# Patient Record
Sex: Female | Born: 1990 | Race: White | Hispanic: No | Marital: Single | Smoking: Never smoker
Health system: Southern US, Community
[De-identification: ages and names within clinical notes are randomized; demographics above are authoritative.]

---

## 2014-08-04 DIAGNOSIS — J301 Allergic rhinitis due to pollen: Secondary | ICD-10-CM | POA: Insufficient documentation

## 2014-08-28 DIAGNOSIS — F988 Other specified behavioral and emotional disorders with onset usually occurring in childhood and adolescence: Secondary | ICD-10-CM | POA: Insufficient documentation

## 2015-05-11 DIAGNOSIS — N926 Irregular menstruation, unspecified: Secondary | ICD-10-CM | POA: Insufficient documentation

## 2018-03-13 ENCOUNTER — Ambulatory Visit: Payer: BC Managed Care – PPO | Admitting: Family Medicine

## 2018-03-13 ENCOUNTER — Encounter: Payer: Self-pay | Admitting: Family Medicine

## 2018-03-13 VITALS — BP 106/71 | HR 73 | Ht 63.0 in | Wt 102.0 lb

## 2018-03-13 DIAGNOSIS — M79604 Pain in right leg: Secondary | ICD-10-CM

## 2018-03-13 NOTE — Patient Instructions (Signed)
I'm concerned you have a stress reaction of your distal tibia. We will go ahead with an MRI of your tibia/fibula to further assess. Ultrasound shows only some very mild edema over the cortex but no fracture - this is about 80% accurate for diagnosis. No running, plyometrics, I'd avoid elliptical too - ok for cycling, swimming.  Use pain as your guide for other activities. Tylenol, meloxicam only if needed. I'll call you with results and next steps. Your left ankle pain is due to peroneal tendinitis and tenosynovitis. Arch supports will help with this if it continues to be an issue (spencos, superfeet) - the [employer] are excellent running shoes. Theraband strengthening of this ankle and continue your rehab with [place].

## 2018-03-14 ENCOUNTER — Ambulatory Visit (HOSPITAL_BASED_OUTPATIENT_CLINIC_OR_DEPARTMENT_OTHER)
Admission: RE | Admit: 2018-03-14 | Discharge: 2018-03-14 | Disposition: A | Discharge status: Home / Self Care | Payer: BC Managed Care – PPO | Source: Ambulatory Visit | Attending: Family Medicine | Admitting: Family Medicine

## 2018-03-14 ENCOUNTER — Encounter (HOSPITAL_BASED_OUTPATIENT_CLINIC_OR_DEPARTMENT_OTHER): Payer: Self-pay | Admitting: Radiology

## 2018-03-14 DIAGNOSIS — M79604 Pain in right leg: Secondary | ICD-10-CM | POA: Diagnosis not present

## 2018-03-15 ENCOUNTER — Encounter: Payer: Self-pay | Admitting: Family Medicine

## 2018-03-15 NOTE — Progress Notes (Signed)
PCP: Herma Carson, MD  Subjective:   HPI: Patient is a 28 y.o. female here for right shin pain.  Patient reports she did a full marathon on February 03, 2018. She did well, felt sore overall but nothing focal. Then did a 45 minute run the following Sunday and felt bad including pain right lower medial shin. Did a 7 mile run on 12/24 and could hardly do so due to pain with impact. Pain has continued with 0/10 at rest and with walking, 7/10 and sharp with impact. Still running a few miles at a time but not daily due to pain. No bruising. Some swelling before the marathon in December but didn't have pain. Takes calcium and vitamin D. Menses are irregular - recently put on OCPs due to this. Has history of one known tibial stress fracture on the left 1.5 years ago. No skin changes, numbness.  History reviewed. No pertinent past medical history.  Current Outpatient Medications on File Prior to Visit  Medication Sig Dispense Refill  . norethindrone-ethinyl estradiol (MICROGESTIN,JUNEL,LOESTRIN) 1-20 MG-MCG tablet 1 p.o. daily    . amphetamine-dextroamphetamine (ADDERALL) 20 MG tablet TK 1 T PO BID     No current facility-administered medications on file prior to visit.     History reviewed. No pertinent surgical history.  No Known Allergies  Social History   Socioeconomic History  . Marital status: Single    Spouse name: Not on file  . Number of children: Not on file  . Years of education: Not on file  . Highest education level: Not on file  Occupational History  . Not on file  Social Needs  . Financial resource strain: Not on file  . Food insecurity:    Worry: Not on file    Inability: Not on file  . Transportation needs:    Medical: Not on file    Non-medical: Not on file  Tobacco Use  . Smoking status: Never Smoker  . Smokeless tobacco: Never Used  Substance and Sexual Activity  . Alcohol use: Not on file  . Drug use: Not on file  . Sexual activity: Not on file   Lifestyle  . Physical activity:    Days per week: Not on file    Minutes per session: Not on file  . Stress: Not on file  Relationships  . Social connections:    Talks on phone: Not on file    Gets together: Not on file    Attends religious service: Not on file    Active member of club or organization: Not on file    Attends meetings of clubs or organizations: Not on file    Relationship status: Not on file  . Intimate partner violence:    Fear of current or ex partner: Not on file    Emotionally abused: Not on file    Physically abused: Not on file    Forced sexual activity: Not on file  Other Topics Concern  . Not on file  Social History Narrative  . Not on file    History reviewed. No pertinent family history.  BP 106/71   Pulse 73   Ht 5\' 3"  (1.6 m)   Wt 102 lb (46.3 kg)   LMP 03/11/2018   BMI 18.07 kg/m   Review of Systems: See HPI above.     Objective:  Physical Exam:  Gen: NAD, comfortable in exam room  Right ankle/lower leg: No gross deformity, swelling, ecchymoses FROM ankle with 5/5 strength - minimal pain  with dorsiflexion of the ankle. TTP focally posterior aspect medial distal tibia. Negative ant drawer and talar tilt.   Negative syndesmotic compression. Thompsons test negative. NV intact distally. Unable to do hop test due to pain distal tibia. Negative fulcrum.  Left ankle/lower leg: No deformity. FROM with 5/5 strength - mild pain with ER.. Minimal tenderness over peroneal tendons.  No other tenderness. NVI distally.   MSK u/s right lower leg:  Very small amount of edema overlying tibial cortex in area of pain.  No cortical irregularity here or neovascularity.  Post tib, flexor digitorum tendons and muscles without abnormalities.  Left lower leg peroneus brevis thickened at level of ankle joint and just distal with neovascularity, target sign.  Assessment & Plan:  1. Right leg pain - Independently reviewed radiographs showing very  thickened anterior tibial cortex without stress fracture - this is also not in area of her tenderness suggesting the thickening is chronic.  No fracture noted on ultrasound or radiographs but these are not as sensitive as MRI.  Her history, exam with inability to perform hop test and focal tenderness in distance runner, oligomenorrhea concerning for stress reaction vs fracture - will go ahead with MRI to further assess.  Tylenol, meloxicam only if needed.  Discussed avoiding running for now.  Activities only that do not cause pain.  Arch supports.  We discussed calcium, vitamin D.  Consider checking 25-OH vit D, bone density, ferritin in future.  Left ankle pain is mild but consistent with peroneal tenosynovitis and tendinitis - will work with her physical therapist.  Also recommended arch supports.

## 2018-03-15 NOTE — Addendum Note (Signed)
Addended by: Kathi Simpers F on: 03/15/2018 10:08 AM   Modules accepted: Orders

## 2018-03-19 ENCOUNTER — Ambulatory Visit
Admission: RE | Admit: 2018-03-19 | Discharge: 2018-03-19 | Disposition: A | Discharge status: Home / Self Care | Payer: BC Managed Care – PPO | Source: Ambulatory Visit | Attending: Family Medicine | Admitting: Family Medicine

## 2018-03-19 DIAGNOSIS — M79604 Pain in right leg: Secondary | ICD-10-CM

## 2018-03-25 ENCOUNTER — Ambulatory Visit: Payer: Self-pay | Admitting: Family Medicine

## 2018-03-25 ENCOUNTER — Encounter

## 2018-03-27 ENCOUNTER — Ambulatory Visit: Payer: BC Managed Care – PPO | Admitting: Family Medicine

## 2018-03-27 ENCOUNTER — Encounter: Payer: Self-pay | Admitting: Family Medicine

## 2018-03-27 VITALS — BP 114/75 | HR 69 | Ht 63.0 in | Wt 102.0 lb

## 2018-03-27 DIAGNOSIS — M79604 Pain in right leg: Secondary | ICD-10-CM

## 2018-03-27 NOTE — Patient Instructions (Signed)
Follow protocol as we discussed. It's likely going to be 1-2 weeks before you can advance to the walk/jog portion of the protocol. Wear aircast when up and walking around regularly. Icing if needed. Cycling/spin for exercise. Follow up with me in 4 weeks.

## 2018-03-27 NOTE — Progress Notes (Signed)
PCP: Herma CarsonMandry, Heidi, MD  Subjective:   HPI: Patient is a 28 y.o. female here for right shin pain.  1/15: Patient reports she did a full marathon on February 03, 2018. She did well, felt sore overall but nothing focal. Then did a 45 minute run the following Sunday and felt bad including pain right lower medial shin. Did a 7 mile run on 12/24 and could hardly do so due to pain with impact. Pain has continued with 0/10 at rest and with walking, 7/10 and sharp with impact. Still running a few miles at a time but not daily due to pain. No bruising. Some swelling before the marathon in December but didn't have pain. Takes calcium and vitamin D. Menses are irregular - recently put on OCPs due to this. Has history of one known tibial stress fracture on the left 1.5 years ago. No skin changes, numbness.  1/29: Patient reports pain is down to 4/10 at worst with walking. Feels like she is improving. Cycling on spin bike without pain unless she puts resistance too high. No skin changes, numbness.  History reviewed. No pertinent past medical history.  Current Outpatient Medications on File Prior to Visit  Medication Sig Dispense Refill  . amphetamine-dextroamphetamine (ADDERALL) 20 MG tablet TK 1 T PO BID    . norethindrone-ethinyl estradiol (MICROGESTIN,JUNEL,LOESTRIN) 1-20 MG-MCG tablet 1 p.o. daily     No current facility-administered medications on file prior to visit.     History reviewed. No pertinent surgical history.  No Known Allergies  Social History   Socioeconomic History  . Marital status: Single    Spouse name: Not on file  . Number of children: Not on file  . Years of education: Not on file  . Highest education level: Not on file  Occupational History  . Not on file  Social Needs  . Financial resource strain: Not on file  . Food insecurity:    Worry: Not on file    Inability: Not on file  . Transportation needs:    Medical: Not on file    Non-medical: Not on  file  Tobacco Use  . Smoking status: Never Smoker  . Smokeless tobacco: Never Used  Substance and Sexual Activity  . Alcohol use: Not on file  . Drug use: Not on file  . Sexual activity: Not on file  Lifestyle  . Physical activity:    Days per week: Not on file    Minutes per session: Not on file  . Stress: Not on file  Relationships  . Social connections:    Talks on phone: Not on file    Gets together: Not on file    Attends religious service: Not on file    Active member of club or organization: Not on file    Attends meetings of clubs or organizations: Not on file    Relationship status: Not on file  . Intimate partner violence:    Fear of current or ex partner: Not on file    Emotionally abused: Not on file    Physically abused: Not on file    Forced sexual activity: Not on file  Other Topics Concern  . Not on file  Social History Narrative  . Not on file    History reviewed. No pertinent family history.  BP 114/75   Pulse 69   Ht 5\' 3"  (1.6 m)   Wt 102 lb (46.3 kg)   LMP 03/11/2018   BMI 18.07 kg/m   Review of Systems:  See HPI above.     Objective:  Physical Exam:  Gen: NAD, comfortable in exam room  Right ankle/lower leg: No gross deformity, swelling, ecchymoses FROM without pain; 5/5 strength TTP mildly medial distal tibia. Negative ant drawer and talar tilt. No pain loading of heel. Thompsons test negative. NV intact distally.  Assessment & Plan:  1. Right leg pain - 2/2 stress reaction of distal tibia.  Improving but still with 4/10 level of pain and has some pain with ambulation.  Long aircast applied today.  We reviewed return to running protocol with aircast.  Icing, calcium and vitamin D.  Cycling for exercise if not painful.  F/u in 4 weeks.  Consider checking 25-OH vit D, bone density, ferritin in future.

## 2019-05-09 ENCOUNTER — Ambulatory Visit: Payer: BC Managed Care – PPO

## 2019-10-29 ENCOUNTER — Other Ambulatory Visit: Payer: Self-pay

## 2019-10-29 ENCOUNTER — Ambulatory Visit: Payer: BC Managed Care – PPO | Admitting: Family Medicine

## 2019-10-29 ENCOUNTER — Encounter: Payer: Self-pay | Admitting: Family Medicine

## 2019-10-29 VITALS — BP 98/68 | Ht 63.0 in | Wt 110.0 lb

## 2019-10-29 DIAGNOSIS — M25552 Pain in left hip: Secondary | ICD-10-CM

## 2019-10-29 NOTE — Patient Instructions (Signed)
You have strain/spasm of your hip external rotators and glut medius. Continue with treatment from [place] and [place] but add the home exercises, stretches. Strengthening exercises do once a day 3 sets of 10. Stretches hold for 20-30 seconds, repeat 3 times. I would refrain from elliptical, running for now and use cycling, swimming for cross training. Use pain as your guide over the next couple weeks on returning to running - call me if you have questions (or mychart). Ibuprofen OR aleve for 7 days then as needed. Ice (or heat, whichever feels better at this point) 15 minutes at a time up to 3-4 times a day. Follow up with me about 1-2 weeks before  [facility] for reevaluation.

## 2019-10-29 NOTE — Progress Notes (Signed)
PCP: Herma Carson, MD  Subjective:   HPI: Patient is a 29 y.o. female here for left hip pain.  Patient is currently training for [facility] in about 5.5 weeks. She reports about 2 weeks ago she started to get pain in left gluteal region. Started about 3 days after doing a different strength workout. Had some general overall soreness, ran about 7 miles on a treadmill and felt pain worsen in left gluteal region. Feels worse after doing elliptical. Has seen PT ([place]) and chiropractor [facility]) without much relief so far. Pain doesn't worsen while running but worse afterwards. Has mainly been focused on elliptical past 2 weeks. No radiation down leg. No numbness/tingling.  History reviewed. No pertinent past medical history.  Current Outpatient Medications on File Prior to Visit  Medication Sig Dispense Refill  . amphetamine-dextroamphetamine (ADDERALL) 20 MG tablet TK 1 T PO BID    . hydrOXYzine (VISTARIL) 25 MG capsule Take 25 mg by mouth daily as needed.    . norethindrone-ethinyl estradiol (MICROGESTIN,JUNEL,LOESTRIN) 1-20 MG-MCG tablet 1 p.o. daily     No current facility-administered medications on file prior to visit.    History reviewed. No pertinent surgical history.  No Known Allergies  Social History   Socioeconomic History  . Marital status: Single    Spouse name: Not on file  . Number of children: Not on file  . Years of education: Not on file  . Highest education level: Not on file  Occupational History  . Not on file  Tobacco Use  . Smoking status: Never Smoker  . Smokeless tobacco: Never Used  Substance and Sexual Activity  . Alcohol use: Not on file  . Drug use: Not on file  . Sexual activity: Not on file  Other Topics Concern  . Not on file  Social History Narrative  . Not on file   Social Determinants of Health   Financial Resource Strain:   . Difficulty of Paying Living Expenses: Not on file  Food Insecurity:   . Worried  About Programme researcher, broadcasting/film/video in the Last Year: Not on file  . Ran Out of Food in the Last Year: Not on file  Transportation Needs:   . Lack of Transportation (Medical): Not on file  . Lack of Transportation (Non-Medical): Not on file  Physical Activity:   . Days of Exercise per Week: Not on file  . Minutes of Exercise per Session: Not on file  Stress:   . Feeling of Stress : Not on file  Social Connections:   . Frequency of Communication with Friends and Family: Not on file  . Frequency of Social Gatherings with Friends and Family: Not on file  . Attends Religious Services: Not on file  . Active Member of Clubs or Organizations: Not on file  . Attends Banker Meetings: Not on file  . Marital Status: Not on file  Intimate Partner Violence:   . Fear of Current or Ex-Partner: Not on file  . Emotionally Abused: Not on file  . Physically Abused: Not on file  . Sexually Abused: Not on file    History reviewed. No pertinent family history.  BP 98/68   Ht 5\' 3"  (1.6 m)   Wt 110 lb (49.9 kg)   BMI 19.49 kg/m   Review of Systems: See HPI above.     Objective:  Physical Exam:  Gen: NAD, comfortable in exam room  Left hip: No deformity, swelling. FROM with 5/5 strength except 5-/5 with  hip abduction.  Pain also with external rotation. Tenderness to palpation over glut medius and piriformis with spasm compared to right. NVI distally. Negative hop. Negative logroll, faber, fadir.   Assessment & Plan:  1. Left hip pain - 2/2 strain/spasm of external rotators (esp piriformis) and glut medius.  Focus on stretching, strengthening exercises which were reviewed today.  Continue working with [place] and [place].  Ibuprofen or aleve for 7 days then as needed.  Ice or heat.  F/u in 4 weeks for reevaluation.  Discussed cycling, swimming for cross training as long as doesn't worsen pain.

## 2020-09-28 IMAGING — MR MR [PERSON_NAME] LOW W/O CM*R*
4 of 5 series · 24 of 40 positions shown · non-contrast
Comparison: None.

CLINICAL DATA: Lower leg trauma, pain

EXAM:
MRI OF LOWER RIGHT EXTREMITY WITHOUT CONTRAST
TECHNIQUE: Multiplanar, multisequence MR imaging of the right lower leg was
performed. No intravenous contrast was administered.

[Series 5: T1 · coronal · 4.0mm · 1.20mm/px · 6 of 26 slices shown (1 of 2)]
[im 1/26]
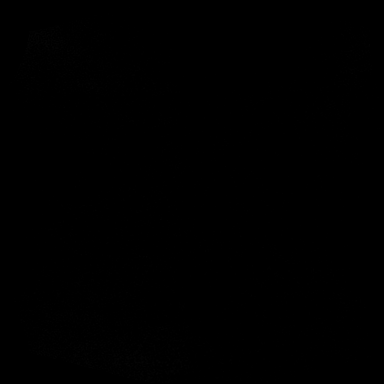
[im 6/26]
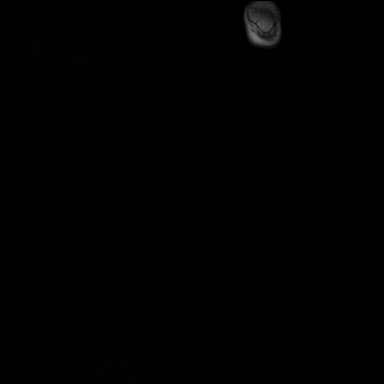
[im 11/26]
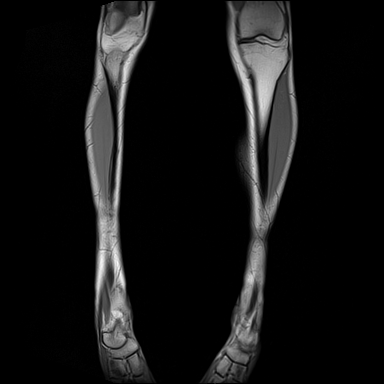
[im 16/26]
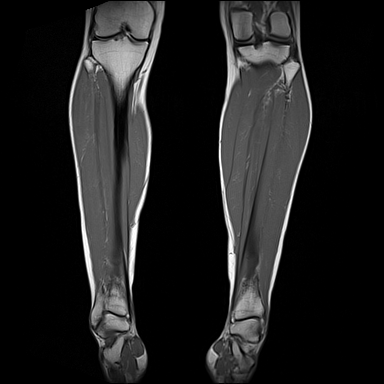
[im 21/26]
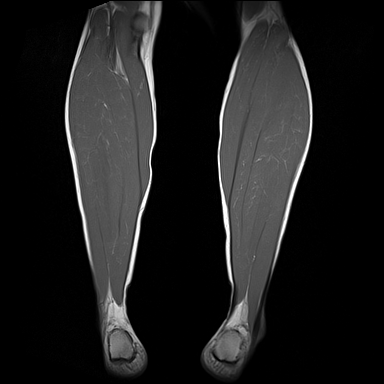
[im 26/26]
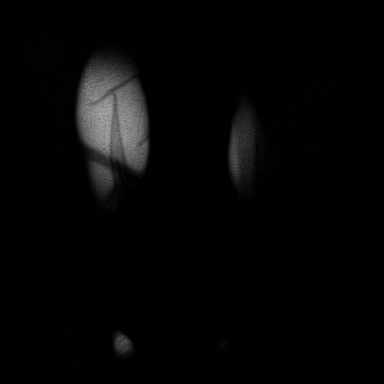

[Series 6: STIR · coronal · 4.0mm · 1.44mm/px · 3 of 26 slices shown]
[im 1/26]
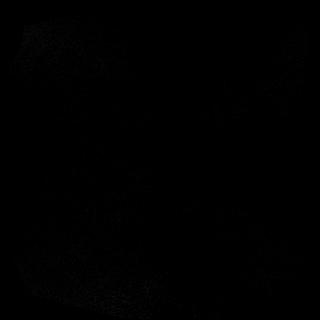
[im 13/26]
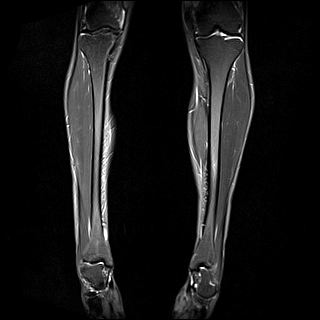
[im 26/26]
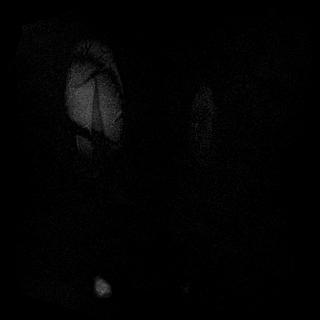

[Series 7: T1 · axial · 5.0mm · 0.59mm/px · z∈[-226,+118]mm · 6 of 57 slices shown (2 of 2)]
[im 1/57]
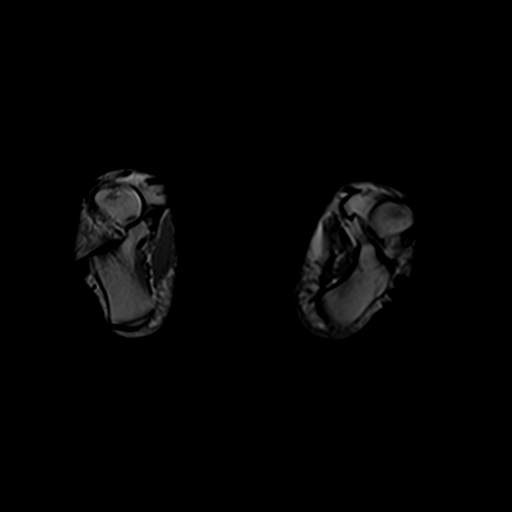
[im 6/57]
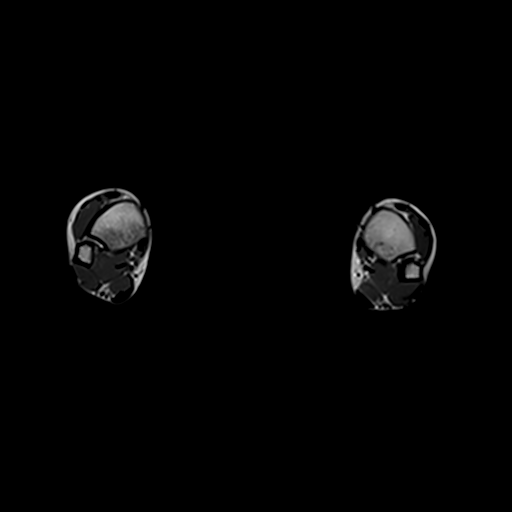
[im 12/57]
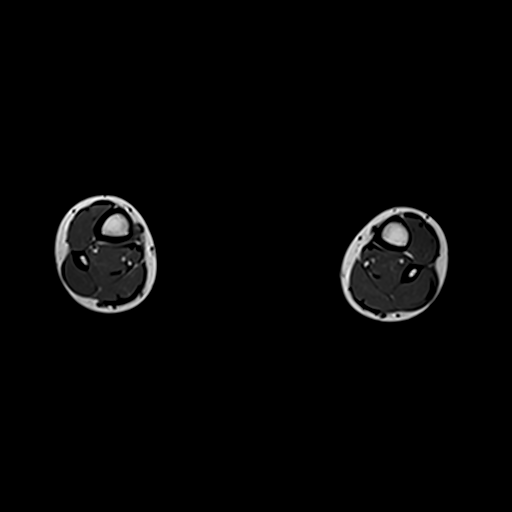
[im 17/57]
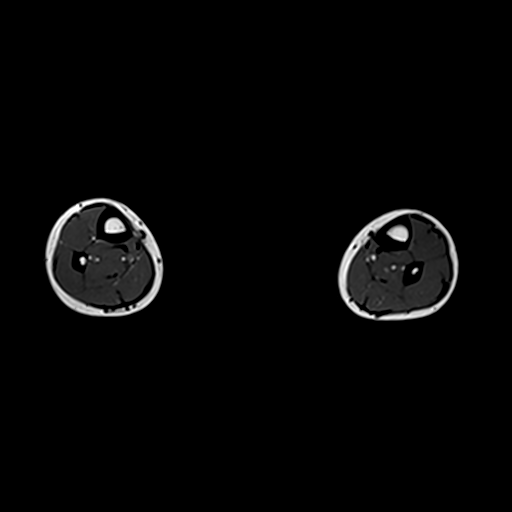
[im 29/57]
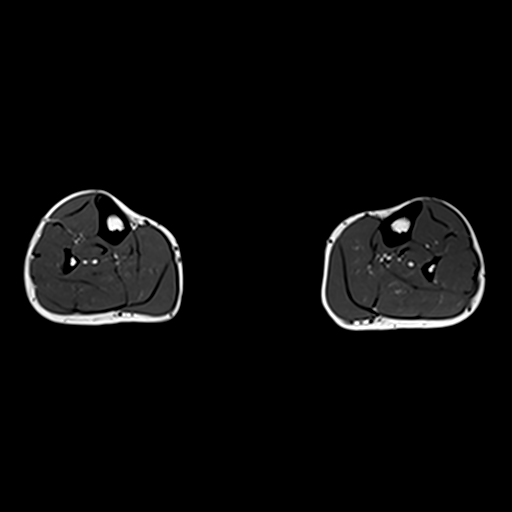
[im 51/57]
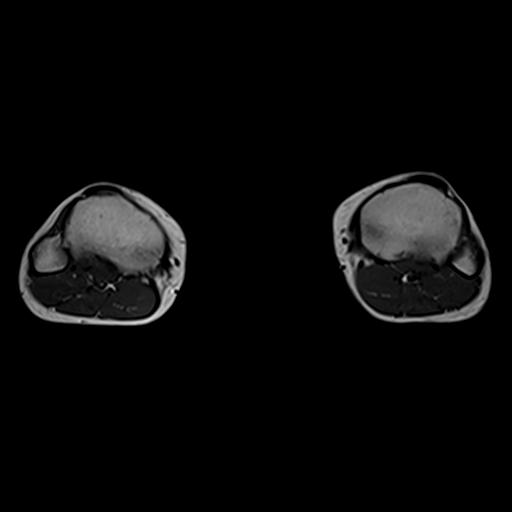

[Series 8: T2 fat-sat · axial · 5.0mm · 0.59mm/px · z∈[-226,+155]mm · 9 of 61 slices shown]
[im 1/61]
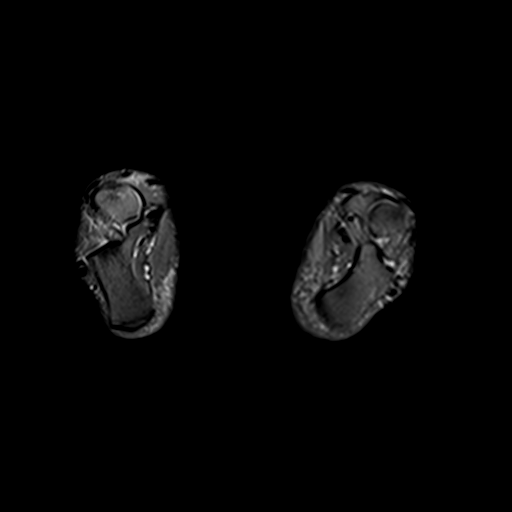
[im 11/61]
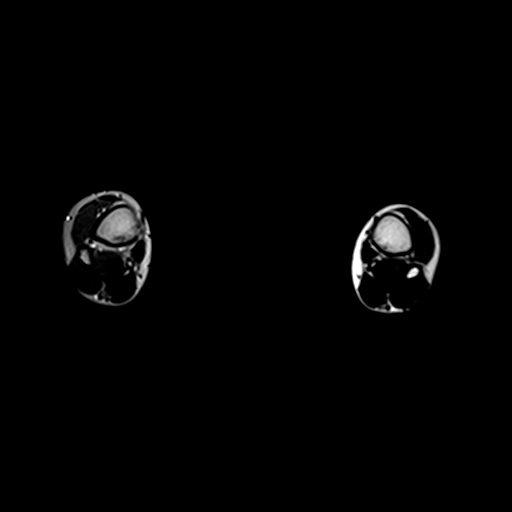
[im 17/61]
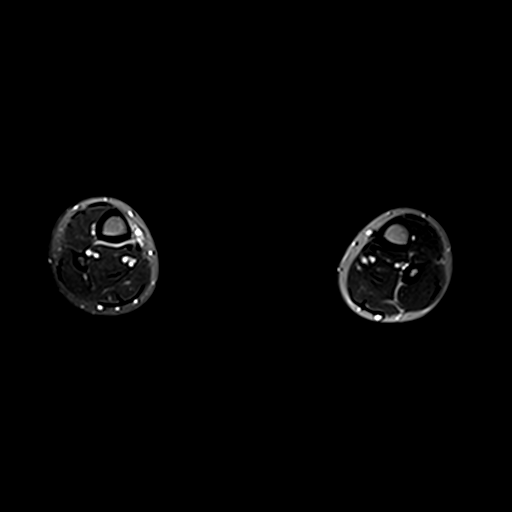
[im 28/61]
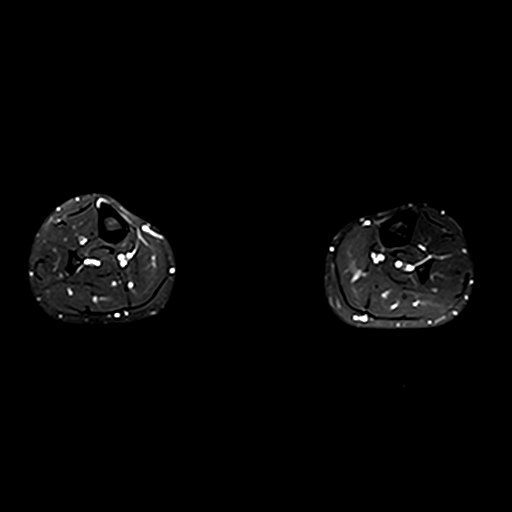
[im 33/61]
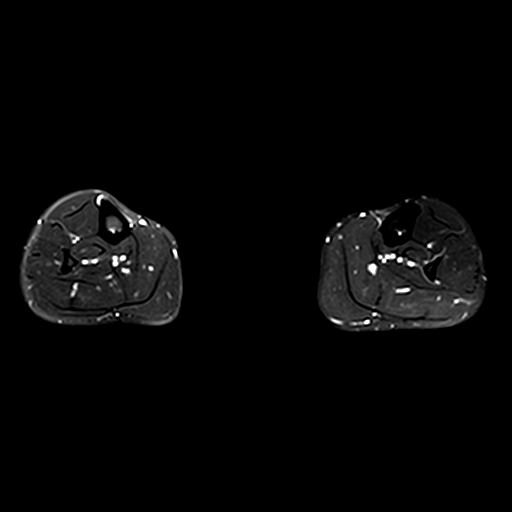
[im 44/61]
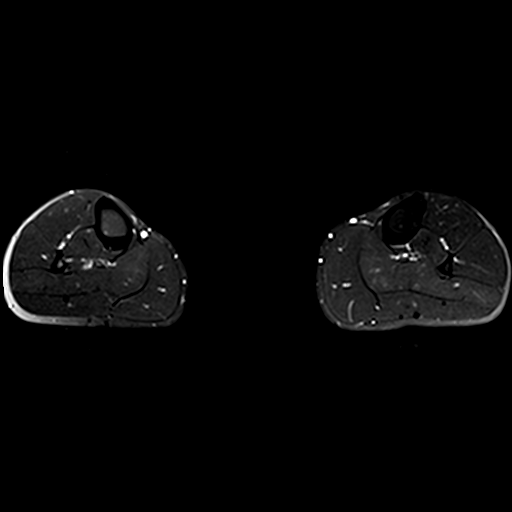
[im 50/61]
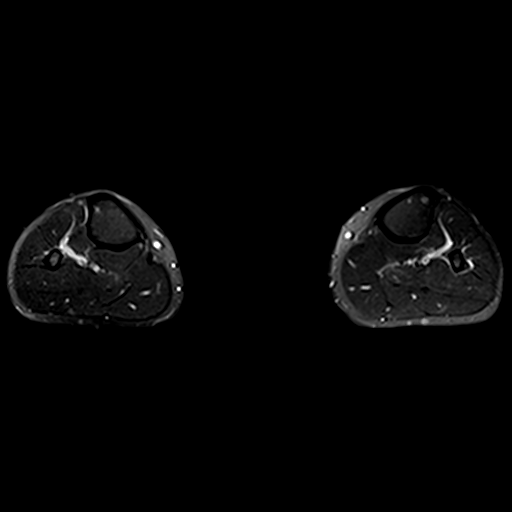
[im 55/61]
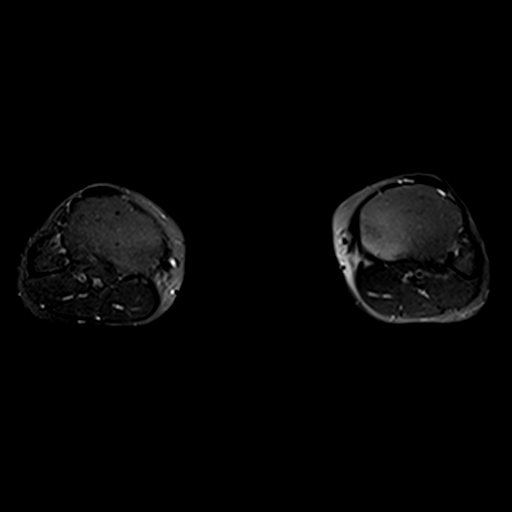
[im 61/61]
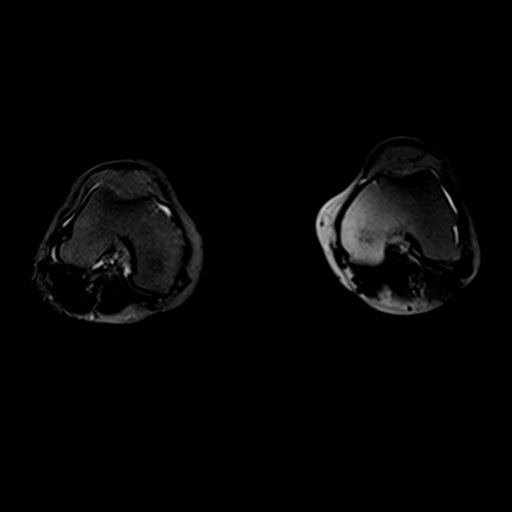

[24 of 40 positions shown; findings below may reference images not displayed]

FINDINGS: Bones/Joint/Cartilage

Mild increased T2 hyperintense signal in the distal right tibial
diametaphysis with mild periostitis. Mild cortical thickening of
distal left fibular diaphysis without associated marrow signal
abnormality likely reflecting old stress related injury. No fracture
or dislocation. Normal alignment. No joint effusion. No aggressive
osseous lesion.

Ligaments

Collateral ligaments are intact.

Muscles and Tendons
Flexor, peroneal and extensor compartment tendons are intact.
Muscles are normal.

Soft tissue
No fluid collection or hematoma.  No soft tissue mass.
IMPRESSION: 1. Mild stress reaction of the distal tibial diametaphysis without a
fracture.
2. Mild cortical thickening of distal left fibular diaphysis without
associated marrow signal abnormality likely reflecting old stress
related injury.
